# Patient Record
Sex: Male | Born: 1990 | Race: Black or African American | Hispanic: No | Marital: Single | Smoking: Never smoker
Health system: Southern US, Community
[De-identification: ages and names within clinical notes are randomized; demographics above are authoritative.]

---

## 2014-05-22 ENCOUNTER — Emergency Department (HOSPITAL_COMMUNITY): Payer: No Typology Code available for payment source

## 2014-05-22 ENCOUNTER — Encounter (HOSPITAL_COMMUNITY): Payer: Self-pay | Admitting: Emergency Medicine

## 2014-05-22 ENCOUNTER — Emergency Department (HOSPITAL_COMMUNITY)
Admission: EM | Admit: 2014-05-22 | Discharge: 2014-05-22 | Disposition: A | Discharge status: Home / Self Care | Payer: No Typology Code available for payment source | Attending: Emergency Medicine | Admitting: Emergency Medicine

## 2014-05-22 DIAGNOSIS — S61509A Unspecified open wound of unspecified wrist, initial encounter: Secondary | ICD-10-CM | POA: Insufficient documentation

## 2014-05-22 DIAGNOSIS — IMO0002 Reserved for concepts with insufficient information to code with codable children: Secondary | ICD-10-CM | POA: Insufficient documentation

## 2014-05-22 DIAGNOSIS — Y92410 Unspecified street and highway as the place of occurrence of the external cause: Secondary | ICD-10-CM | POA: Insufficient documentation

## 2014-05-22 DIAGNOSIS — S61511A Laceration without foreign body of right wrist, initial encounter: Secondary | ICD-10-CM

## 2014-05-22 DIAGNOSIS — Y9389 Activity, other specified: Secondary | ICD-10-CM | POA: Insufficient documentation

## 2014-05-22 NOTE — ED Provider Notes (Signed)
Medical screening examination/treatment/procedure(s) were performed by non-physician practitioner and as supervising physician I was immediately available for consultation/collaboration.   EKG Interpretation None        Kristen N Ward, DO 05/22/14 2302 
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
